# Patient Record
Sex: Male | Born: 1985 | Race: White | Hispanic: No | Marital: Married | Smoking: Former smoker
Health system: Southern US, Community
[De-identification: ages and names within clinical notes are randomized; demographics above are authoritative.]

## PROBLEM LIST (undated history)

## (undated) HISTORY — PX: INGUINAL HERNIA REPAIR: SUR1180

## (undated) HISTORY — PX: HERNIA REPAIR: SHX51

---

## 2008-09-13 ENCOUNTER — Ambulatory Visit: Payer: Self-pay | Admitting: Family Medicine

## 2011-09-24 ENCOUNTER — Ambulatory Visit: Payer: Self-pay | Admitting: Internal Medicine

## 2011-09-24 LAB — URINALYSIS, COMPLETE
Nitrite: NEGATIVE
Protein: NEGATIVE
Specific Gravity: 1.005 (ref 1.003–1.030)

## 2011-09-28 ENCOUNTER — Emergency Department: Payer: Self-pay | Admitting: Emergency Medicine

## 2011-09-29 LAB — URINALYSIS, COMPLETE
Bilirubin,UR: NEGATIVE
Protein: NEGATIVE
RBC,UR: >30 /HPF (ref 0–5)

## 2011-09-29 LAB — CBC WITH DIFFERENTIAL/PLATELET
Basophil #: 0.1 10*3/uL (ref 0.0–0.1)
Basophil %: 0.5 %
Eosinophil #: 0.3 10*3/uL (ref 0.0–0.7)
Eosinophil %: 2.9 %
Lymphocyte #: 2.3 10*3/uL (ref 1.0–3.6)
Lymphocyte %: 19 %
MCH: 28.5 pg (ref 26.0–34.0)
MCHC: 33.4 g/dL (ref 32.0–36.0)
MCV: 85 fL (ref 80–100)
Monocyte #: 0.6 10*3/uL (ref 0.0–0.7)
Platelet: 199 10*3/uL (ref 150–440)
RBC: 5.12 10*6/uL (ref 4.40–5.90)

## 2011-09-29 LAB — COMPREHENSIVE METABOLIC PANEL
Albumin: 4.3 g/dL (ref 3.4–5.0)
BUN: 14 mg/dL (ref 7–18)
Calcium, Total: 9.2 mg/dL (ref 8.5–10.1)
Creatinine: 0.87 mg/dL (ref 0.60–1.30)
Glucose: 90 mg/dL (ref 65–99)
Potassium: 3.6 mmol/L (ref 3.5–5.1)
SGOT(AST): 69 U/L — ABNORMAL HIGH (ref 15–37)

## 2012-02-08 ENCOUNTER — Ambulatory Visit: Payer: Self-pay | Admitting: Medical

## 2012-02-15 ENCOUNTER — Ambulatory Visit: Payer: Self-pay | Admitting: Internal Medicine

## 2013-07-12 ENCOUNTER — Ambulatory Visit: Payer: Self-pay | Admitting: Family Medicine

## 2014-03-16 ENCOUNTER — Ambulatory Visit: Payer: Self-pay | Admitting: Physician Assistant

## 2016-08-18 ENCOUNTER — Ambulatory Visit
Admission: EM | Admit: 2016-08-18 | Discharge: 2016-08-18 | Disposition: A | Discharge status: Home / Self Care | Attending: Family Medicine | Admitting: Family Medicine

## 2016-08-18 ENCOUNTER — Ambulatory Visit (INDEPENDENT_AMBULATORY_CARE_PROVIDER_SITE_OTHER)

## 2016-08-18 ENCOUNTER — Encounter: Payer: Self-pay | Admitting: *Deleted

## 2016-08-18 DIAGNOSIS — M79671 Pain in right foot: Secondary | ICD-10-CM

## 2016-08-18 NOTE — Discharge Instructions (Signed)
Rest. Ice.  Follow up with Podiatry for continued pain as discussed.   Follow up with your primary care physician this week as needed. Return to Urgent care for new or worsening concerns.

## 2016-08-18 NOTE — ED Provider Notes (Signed)
[facility] ____________________________________________  Time seen: Approximately 10:00 AM  I have reviewed the triage vital signs and the nursing notes.   HISTORY  Chief Complaint Foot Pain   HPI Frederick Parker is a 31 y.o. male  presenting for the complaints of right lateral foot pain. Patient reports pain is been present for just over a week. Patient reports a week ago while he was running he noticed a mild pain to his right foot, and reports pain has gradually worsened. Denies any specific injury or rolling of his foot. Patient denies any recent increase of running or activity. Patient reports that he has a frequent runner. Patient again denies any injury. Denies any break in skin, redness, swelling, coloration changes. Denies any pain radiation, paresthesias. Denies taking over-the-counter medications for the same complaint. Patient reports his he has continued to remain ambulatory.  Denies other complaints. Reports otherwise feels well. Denies recent sickness or immobilization.   History reviewed. No pertinent past medical history.  There are no active problems to display for this patient.   Past Surgical History:  Procedure Laterality Date  . HERNIA REPAIR        No current facility-administered medications for this encounter.  No current outpatient prescriptions on file.  Allergies Patient has no known allergies.  History reviewed. No pertinent family history.  Social History Social History  Substance Use Topics  . Smoking status: Never Smoker  . Smokeless tobacco: Never Used  . Alcohol use Yes    Review of Systems Constitutional: No fever/chills ENT: No sore throat. Cardiovascular: Denies chest pain. Respiratory: Denies shortness of breath. Gastrointestinal: No abdominal pain.   Musculoskeletal: Negative for back pain.As above. Skin: Negative for rash. Neurological: Negative for focal weakness or numbness.  10-point ROS otherwise  negative.  ____________________________________________   PHYSICAL EXAM:  VITAL SIGNS: ED Triage Vitals  Enc Vitals Group     BP 08/18/16 0927 129/81     Pulse Rate 08/18/16 0927 70     Resp 08/18/16 0927 16     Temp 08/18/16 0927 98 F (36.7 C)     Temp Source 08/18/16 0927 Oral     SpO2 08/18/16 0927 100 %     Weight 08/18/16 0929 150 lb (68 kg)     Height 08/18/16 0929 5\' 5"  (1.651 m)     Head Circumference --      Peak Flow --      Pain Score 08/18/16 0932 7     Pain Loc --      Pain Edu? --      Excl. in GC? --     Constitutional: Alert and oriented. Well appearing and in no acute distress. Eyes: Conjunctivae are normal. PERRL. EOMI. ENT      Head: Normocephalic and atraumatic.      Mouth/Throat: Mucous membranes are moist. Cardiovascular: Normal rate, regular rhythm. Grossly normal heart sounds.  Good peripheral circulation. Respiratory: Normal respiratory effort without tachypnea nor retractions. Breath sounds are clear and equal bilaterally. No wheezes/rales/rhonchi.. Musculoskeletal:  Ambulatory with mild antalgic gait. Right foot nontender to direct palpation, full range of motion, no swelling, no erythema, no ecchymosis. With patient in standing position and full weightbearing on right foot, pain and tenderness noted to base of right fifth metatarsal. Right lower extremity otherwise nontender. Right foot normal distal sensation. Bilateral pedal pulses equal and easily palpated. Neurologic:  Normal speech and language. Speech is normal. No gait instability.  Skin:  Skin is warm, dry and intact.  No rash noted. Psychiatric: Mood and affect are normal. Speech and behavior are normal. Patient exhibits appropriate insight and judgment   ___________________________________________   LABS (all labs ordered are listed, but only abnormal results are displayed)  Labs Reviewed - No data to display ____________________________________________  RADIOLOGY  Dg Foot  Complete Right  Result Date: 08/18/2016 CLINICAL DATA:  Right lateral foot pain.  No known injury. EXAM: RIGHT FOOT COMPLETE - 3+ VIEW COMPARISON:  None. FINDINGS: There is no evidence of fracture or dislocation. There is no evidence of arthropathy or other focal bone abnormality. Soft tissues are unremarkable. IMPRESSION: Normal examination. Electronically Signed   By: Beckie Salts M.D.   On: 08/18/2016 10:17   ____________________________________________   PROCEDURES Procedures   Declines need for crutches or splinting. ____________________________________________   INITIAL IMPRESSION / ASSESSMENT AND PLAN / ED COURSE  Pertinent labs & imaging results that were available during my care of the patient were reviewed by me and considered in my medical decision making (see chart for details).  Well-appearing patient. No acute distress. Presents with complaints of right foot pain gradual in onset after running. Denies known injury. Denies other complaints. Right foot x-ray no acute abnormality per radiologist. Discussed with patient suspect inflammatory process. Encourage rest, ice, elevate and over-the-counter NSAIDs as needed. Patient denies need for prescription medication. Information given for podiatry for follow-up.  Discussed follow up with Primary care physician this week. Discussed follow up and return parameters including no resolution or any worsening concerns. Patient verbalized understanding and agreed to plan.   ____________________________________________   FINAL CLINICAL IMPRESSION(S) / ED DIAGNOSES  Final diagnoses:  Right foot pain     There are no discharge medications for this patient.   Note: This dictation was prepared with Dragon dictation along with smaller phrase technology. Any transcriptional errors that result from this process are unintentional.         Renford Dills, NP 08/18/16 1037

## 2016-08-18 NOTE — ED Triage Notes (Signed)
Pt runs regularly, last ran Saturday a week ago without difficulty and no injury. Onset of right foot pain on Sunday which gradually worsened over week. Pt has been resting and icing the foot without relief. States pain is to lateral aspect of right foot. No edema, deformity, or discoloration.

## 2016-08-22 ENCOUNTER — Ambulatory Visit (INDEPENDENT_AMBULATORY_CARE_PROVIDER_SITE_OTHER): Admitting: Podiatry

## 2016-08-22 ENCOUNTER — Encounter: Payer: Self-pay | Admitting: Podiatry

## 2016-08-22 VITALS — BP 139/90 | HR 61 | Resp 16

## 2016-08-22 DIAGNOSIS — M7671 Peroneal tendinitis, right leg: Secondary | ICD-10-CM | POA: Diagnosis not present

## 2016-08-22 DIAGNOSIS — M7751 Other enthesopathy of right foot: Secondary | ICD-10-CM

## 2016-08-22 MED ORDER — METHYLPREDNISOLONE 4 MG PO TBPK: ORAL_TABLET | ORAL | 0 refills | Status: DC

## 2016-08-22 NOTE — Progress Notes (Signed)
   Subjective:    Patient ID: Frederick Parker, male    DOB: 02/23/1986, 31 y.o.   MRN: [id number]  HPI    Review of Systems  All other systems reviewed and are negative.      Objective:   Physical Exam        Assessment & Plan:

## 2016-08-27 ENCOUNTER — Encounter: Payer: Self-pay | Admitting: Podiatry

## 2016-08-31 MED ORDER — BETAMETHASONE SOD PHOS & ACET 6 (3-3) MG/ML IJ SUSP: 3.0000 mg | Freq: Once | INTRAMUSCULAR | Status: AC

## 2016-08-31 NOTE — Progress Notes (Signed)
Patient ID: Frederick Parker, male   DOB: 04/14/1986, 31 y.o.   MRN: [id number]   Subjective:  Patient presents today for right lateral foot pain 2 weeks. Patient states that is sore to walk and run. Ibuprofen has no relief. Patient presents today for further treatment    Objective/Physical Exam General: The patient is alert and oriented x3 in no acute distress.  Dermatology: Skin is warm, dry and supple bilateral lower extremities. Negative for open lesions or macerations.  Vascular: Palpable pedal pulses bilaterally. No edema or erythema noted. Capillary refill within normal limits.  Neurological: Epicritic and protective threshold grossly intact bilaterally.   Musculoskeletal Exam: Pain on palpation noted to the insertion of the peroneal brevis tendon at the fifth metatarsal tubercle system of the peroneal brevis enthesopathy. Range of motion within normal limits to all pedal and ankle joints bilateral. Muscle strength 5/5 in all groups bilateral.   Radiographic Exam:  Normal osseous mineralization. Joint spaces preserved. No fracture/dislocation/boney destruction.    Assessment: #1 peroneal enthesopathy right foot 2 pain in right foot   Plan of Care:  #1 Patient was evaluated. #2 injection of 0.5 mL Celestone Soluspan injected right foot at the insertion of the peroneal brevis into the fifth metatarsal tubercle #3 cam boot dispensed #4 compression anklet dispensed #5 prescription for Medrol Dosepak. After completion of the Medrol Dosepak continue Motrin 800 #6 return to clinic in 2 weeks  Patient works for the [employer]. His next weekend is February 8. He will require restrictions and work duty release doctor's note.    Felecia ShellingBrent M. Aashritha Miedema, DPM [facility]  Dr. Felecia ShellingBrent M. Latriece Anstine, DPM    [address]                                        [place], [place] [address]                Office [phone]  Fax [phone]

## 2016-09-03 ENCOUNTER — Telehealth: Payer: Self-pay | Admitting: Podiatry

## 2016-09-03 NOTE — Telephone Encounter (Signed)
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 He states his foot is getting better but he cannot walk/run long distances due to pain.His guard starts Friday morning so he is not able to come in then. I did schedule him to see Dr Ardelle AntonWagoner but since you seen the pt previously are you able to write the note and let him see you next week.

## 2016-09-04 ENCOUNTER — Encounter: Payer: Self-pay | Admitting: Podiatry

## 2016-09-04 ENCOUNTER — Ambulatory Visit (INDEPENDENT_AMBULATORY_CARE_PROVIDER_SITE_OTHER): Admitting: Podiatry

## 2016-09-04 ENCOUNTER — Ambulatory Visit: Admitting: Podiatry

## 2016-09-04 DIAGNOSIS — M7671 Peroneal tendinitis, right leg: Secondary | ICD-10-CM | POA: Diagnosis not present

## 2016-09-04 MED ORDER — MELOXICAM 15 MG PO TABS: 15.0000 mg | ORAL_TABLET | Freq: Every day | ORAL | 2 refills | Status: AC

## 2016-09-04 NOTE — Patient Instructions (Signed)
Peroneal Tendinopathy Rehab Ask your health care provider which exercises are safe for you. Do exercises exactly as told by your health care provider and adjust them as directed. It is normal to feel mild stretching, pulling, tightness, or discomfort as you do these exercises, but you should stop right away if you feel sudden pain or your pain gets worse.Do not begin these exercises until told by your health care provider. Stretching and range of motion exercises These exercises warm up your muscles and joints and improve the movement and flexibility of your ankle. These exercises also help to relieve pain and stiffness. Exercise A: Gastroc and soleus, standing  1. Stand on the edge of a step on the balls of your feet. The ball of your foot is on the walking surface, right under your toes. 2. Hold onto the railing for balance. 3. Slowly lift your left / right foot, allowing your body weight to press your left / right heel down over the edge of the step. You should feel a stretch in your left / right calf. 4. Hold this position for __________ seconds. Repeat __________ times with your left / right knee straight and __________ times with your left / right knee bent. Complete this stretch __________ times per day. Strengthening exercises These exercises improve the strength and endurance of your foot and ankle. Endurance is the ability to use your muscles for a long time, even after they get tired. Exercise B: Dorsiflexors   1. Secure a rubber exercise band or tube to an object, like a table leg, that will not move if it is pulled on. 2. Secure the other end of the band around your left / right foot. 3. Sit on the floor, facing the object with your left / right foot extended. The band or tube should be slightly tense when your foot is relaxed. 4. Slowly flex your left / right ankle and toes to bring your foot toward you. 5. Hold this position for __________ seconds. 6. Slowly return your foot to the  starting position. Repeat __________ times. Complete this exercise __________ times per day. Exercise C: Evertors  1. Sit on the floor with your legs straight out in front of you. 2. Loop a rubber exercise or band or tube around the ball of your left / right foot. The ball of your foot is on the walking surface, right under your toes. 3. Hold the ends of the band in your hands, or secure the band to a stable object. 4. Slowly push your foot outward, away from your other leg. 5. Hold this position for __________ seconds. 6. Slowly return your foot to the starting position. Repeat __________ times. Complete this exercise __________ times per day. Exercise D: Standing heel raise (  plantar flexion) 1. Stand with your feet shoulder-width apart with the balls of your feet on a step. The ball of your foot is on the walking surface, right under your toes. 2. Keep your weight spread evenly over the width of your feet while you rise up on your toes. Use a wall or railing to steady yourself, but try not to use it for support. 3. If this exercise is too easy, try these options:  Shift your weight toward your left / right leg until you feel challenged.  If told by your health care provider, stand on your left / right leg only. 4. Hold this position for __________ seconds. Repeat __________ times. Complete this exercise __________ times per day. Exercise E:   Single leg stand 1. Without shoes, stand near a railing or in a doorway. You may hold onto the railing or door frame as needed. 2. Stand on your left / right foot. Keep your big toe down on the floor and try to keep your arch lifted.  Do not roll to the outside of your foot.  If this exercise is too easy, you can try it with your eyes closed or while standing on a pillow. 3. Hold this position for __________ seconds. Repeat __________ times. Complete this exercise __________ times per day. This information is not intended to replace advice given  to you by your health care provider. Make sure you discuss any questions you have with your health care provider. Document Released: 07/14/2005 Document Revised: 03/20/2016 Document Reviewed: 06/02/2015 Elsevier Interactive Patient Education  2017 Elsevier Inc.  

## 2016-09-08 NOTE — Progress Notes (Addendum)
Subjective: 31 year old male presents the also concerns her right foot pain. 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 He states the injection did help some he has not been wearing the cam boot as it still is painful with the cam boot. He did complete the Medrol Dosepak is been taking anti-inflammatories as needed. The area does continue to become painful. Denies any systemic complaints such as fevers, chills, nausea, vomiting. No acute changes since last appointment, and no other complaints at this time.   Objective: AAO x3, NAD DP/PT pulses palpable bilaterally, CRT less than 3 seconds There is tenderness of the right foot along the distal portion the peroneal tendon on the insertion of the fifth metatarsal base. Peroneal tendon appears to be intact. There is no significant edema, erythema, increase in warmth. Mild pain with eversion. No area pinpoint bony tenderness or pain vibratory sensation. Decrease in medial arch present. No open lesions or pre-ulcerative lesions.  No pain with calf compression, swelling, warmth, erythema  Assessment: Insertional peroneal tendinitis right foot  Plan: -All treatment options discussed with the patient including all alternatives, risks, complications.  -Estimate plantar fascial brace was dispensed and applied from medial to lateral. -I discussed an orthotic as well as shoe gear modifications help develop more support. -Stretching and rehabilitation exercises for peroneal tendinitis. -Ice to the area -Continue anti-inflammatories as needed -Note provided for him to stay out of guard this weekend.  -Patient encouraged to call the office with any questions, concerns, change in symptoms.   Frederick CurdMatthew Parker, DPM

## 2016-09-25 ENCOUNTER — Telehealth: Payer: Self-pay | Admitting: *Deleted

## 2016-09-25 ENCOUNTER — Ambulatory Visit (INDEPENDENT_AMBULATORY_CARE_PROVIDER_SITE_OTHER): Admitting: Podiatry

## 2016-09-25 ENCOUNTER — Encounter: Payer: Self-pay | Admitting: Podiatry

## 2016-09-25 ENCOUNTER — Telehealth: Payer: Self-pay | Admitting: Podiatry

## 2016-09-25 VITALS — BP 121/87 | HR 72 | Resp 18

## 2016-09-25 DIAGNOSIS — M7671 Peroneal tendinitis, right leg: Secondary | ICD-10-CM | POA: Diagnosis not present

## 2016-09-25 DIAGNOSIS — M779 Enthesopathy, unspecified: Secondary | ICD-10-CM

## 2016-09-25 NOTE — Telephone Encounter (Signed)
Pt. Frederick Parker that he needs a referral for PT.

## 2016-09-25 NOTE — Telephone Encounter (Signed)
Pt states he need PT.

## 2016-09-25 NOTE — Telephone Encounter (Signed)
I gave Frederick Parker a [facility] PT note today. If not could you fax it for him for peroneal tendonitis, eval and treat. Thanks.

## 2016-09-25 NOTE — Progress Notes (Signed)
Subjective: 31 year old male presents to the office today for follow-up evaluation of right foot pain, insertional peroneal tendinitis. He states that since last appointment he is doing much better on a regular basis with walking however he did try to run and he was only able to do so for couple of minutes before the pain started and he had to stop. He has been doing the stretching, icing at home. He denies any swelling or any recent injury or trauma.  Denies any systemic complaints such as fevers, chills, nausea, vomiting. No acute changes since last appointment, and no other complaints at this time.   Objective: AAO x3, NAD DP/PT pulses palpable bilaterally, CRT less than 3 seconds There is currently no tenderness of the right foot along the distal portion the peroneal tendon on the insertion of the fifth metatarsal base. Peroneal tendon appears to be intact. There is no significant edema, erythema, increase in warmth. Mild pain with eversion. No area pinpoint bony tenderness or pain vibratory sensation. Decrease in medial arch present. No open lesions or pre-ulcerative lesions.  No pain with calf compression, swelling, warmth, erythema  Assessment: Insertional peroneal tendinitis right foot currently without symptoms however with pain after activity  Plan: -All treatment options discussed with the patient including all alternatives, risks, complications.  -He has a good over-the-counter insert and did add a lateral post to see if this will help. Discussed with him that if symptoms continue may need a custom insert. We'll also start physical therapy to help rehabilitation this and get him back to running. A note was provided today to hold off on running while at [employer] this weekend. -Continue anti-inflammatories as needed -Patient encouraged to call the office with any questions, concerns, change in symptoms.   Frederick CurdMatthew Ashad Parker, DPM

## 2016-10-28 ENCOUNTER — Encounter: Payer: Self-pay | Admitting: Podiatry

## 2016-10-28 ENCOUNTER — Ambulatory Visit (INDEPENDENT_AMBULATORY_CARE_PROVIDER_SITE_OTHER): Admitting: Podiatry

## 2016-10-28 DIAGNOSIS — S86311D Strain of muscle(s) and tendon(s) of peroneal muscle group at lower leg level, right leg, subsequent encounter: Secondary | ICD-10-CM | POA: Diagnosis not present

## 2016-10-28 DIAGNOSIS — M7671 Peroneal tendinitis, right leg: Secondary | ICD-10-CM

## 2016-10-29 NOTE — Progress Notes (Signed)
   Subjective: 31 year old male presents to the office today for follow-up evaluation of right foot pain, insertional peroneal tendinitis. Pt states he is doing better. He reports he is still in physical therapy and has finished his antiinflammatory medication. Walking for more than 30-60 minutes at a time increases his pain. He has been doing the stretching, icing at home. He denies any swelling or any recent injury or trauma.  Denies any systemic complaints such as fevers, chills, nausea, vomiting. No acute changes since last appointment, and no other complaints at this time.      Objective/Physical Exam General: The patient is alert and oriented x3 in no acute distress.  Dermatology: Skin is warm, dry and supple bilateral lower extremities. Negative for open lesions or macerations.  Vascular: Palpable pedal pulses bilaterally. No edema or erythema noted. Capillary refill within normal limits.  Neurological: Epicritic and protective threshold grossly intact bilaterally.   Musculoskeletal Exam: Pain on palpation to the insertion of the peroneal tendons of the right foot.   Assessment: #1 Insertional peroneal tendinitis of the right foot   Plan of Care:  #1 Patient was evaluated. #2 MRI ordered #3 RTC 3-4 weeks   Felecia Shelling, DPM [facility]  Dr. Felecia Shelling, DPM    [address]                                        [place], [place] [address]                Office [phone]  Fax [phone]

## 2016-10-31 ENCOUNTER — Telehealth: Payer: Self-pay | Admitting: *Deleted

## 2016-10-31 DIAGNOSIS — T148XXA Other injury of unspecified body region, initial encounter: Secondary | ICD-10-CM

## 2016-10-31 NOTE — Telephone Encounter (Addendum)
-----   Message from Felecia Shelling, DPM sent at 10/30/2016  3:42 PM EDT ----- Regarding: MRI right foot Please order MRI right foot w/ or w/out contrast.  Dx : Possible peroneal tendon tear right lower extremity  Thanks, Dr. Logan Bores. 10/31/2016-Orders faxed to [facility] for pre-cert and to [facility]. 11/03/2016-Routed message to [facility].

## 2016-11-06 NOTE — Telephone Encounter (Signed)
No prior auth required for MRI.  Patient called and informed via voice mail to call radiology scheduling to set up appointment.

## 2016-11-20 ENCOUNTER — Ambulatory Visit
Admission: RE | Admit: 2016-11-20 | Discharge: 2016-11-20 | Disposition: A | Discharge status: Home / Self Care | Source: Ambulatory Visit | Attending: Podiatry | Admitting: Podiatry

## 2016-11-20 DIAGNOSIS — S96801A Unspecified injury of other specified muscles and tendons at ankle and foot level, right foot, initial encounter: Secondary | ICD-10-CM | POA: Diagnosis present

## 2016-11-25 ENCOUNTER — Encounter: Payer: Self-pay | Admitting: Podiatry

## 2016-11-25 ENCOUNTER — Ambulatory Visit (INDEPENDENT_AMBULATORY_CARE_PROVIDER_SITE_OTHER): Admitting: Podiatry

## 2016-11-25 DIAGNOSIS — M7671 Peroneal tendinitis, right leg: Secondary | ICD-10-CM

## 2016-11-25 NOTE — Progress Notes (Signed)
   Subjective: 31 year old male presents to the office today for follow-up evaluation of right foot pain, insertional peroneal tendinitis. Pt states he is doing better although standing for long periods of time still cause him pain. He is here for his MRI results as well. He denies any new complaints at this time.     Objective/Physical Exam General: The patient is alert and oriented x3 in no acute distress.  Dermatology: Skin is warm, dry and supple bilateral lower extremities. Negative for open lesions or macerations.  Vascular: Palpable pedal pulses bilaterally. No edema or erythema noted. Capillary refill within normal limits.  Neurological: Epicritic and protective threshold grossly intact bilaterally.   Musculoskeletal Exam: Pain on palpation to the insertion of the peroneal tendons of the right foot.   MRI results: Intact and normal appearing peroneal tendons. Normal MRI right ankle.  Assessment: #1 Insertional peroneal tendinitis of the right foot   Plan of Care:  #1 Patient was evaluated. MRI reviewed.  #2 Injection of 0.5 mLs Celestone Soluspan injected into the insertion of the peroneal tendons-right #3 Immobilize in CAM boot x 4 weeks #4 Return to clinic in 4 weeks   Felecia Shelling, DPM [facility]  Dr. Felecia Shelling, DPM    [address]                                        [place], [place] [address]                Office [phone]  Fax [phone]

## 2016-11-27 MED ORDER — BETAMETHASONE SOD PHOS & ACET 6 (3-3) MG/ML IJ SUSP: 3.0000 mg | Freq: Once | INTRAMUSCULAR | Status: AC

## 2016-11-28 ENCOUNTER — Encounter: Payer: Self-pay | Admitting: Podiatry

## 2016-12-26 ENCOUNTER — Ambulatory Visit: Admitting: Podiatry

## 2018-07-12 IMAGING — CR DG FOOT COMPLETE 3+V*R*
3 series · 3 of 3 positions shown · non-contrast
Comparison: None.

CLINICAL DATA: Right lateral foot pain.  No known injury.

EXAM:
RIGHT FOOT COMPLETE - 3+ VIEW

[foot ap]
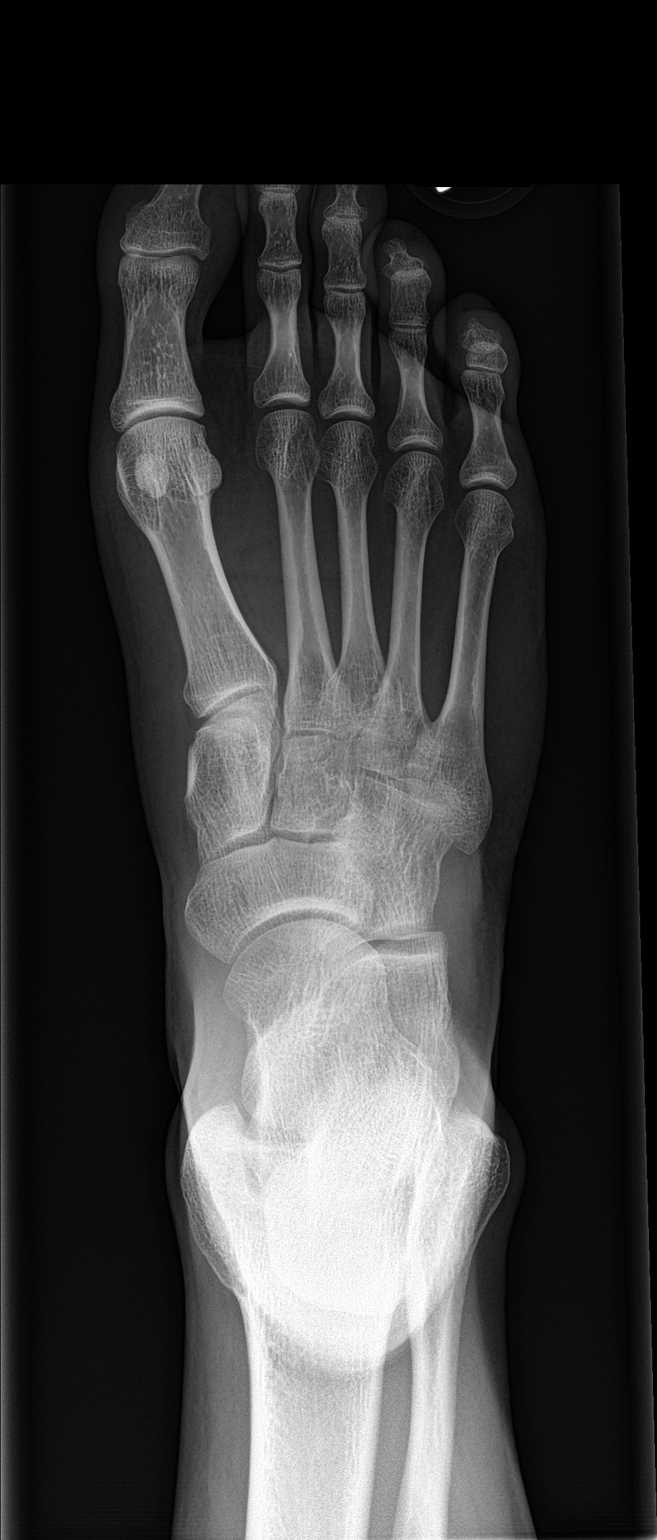

[foot obl]
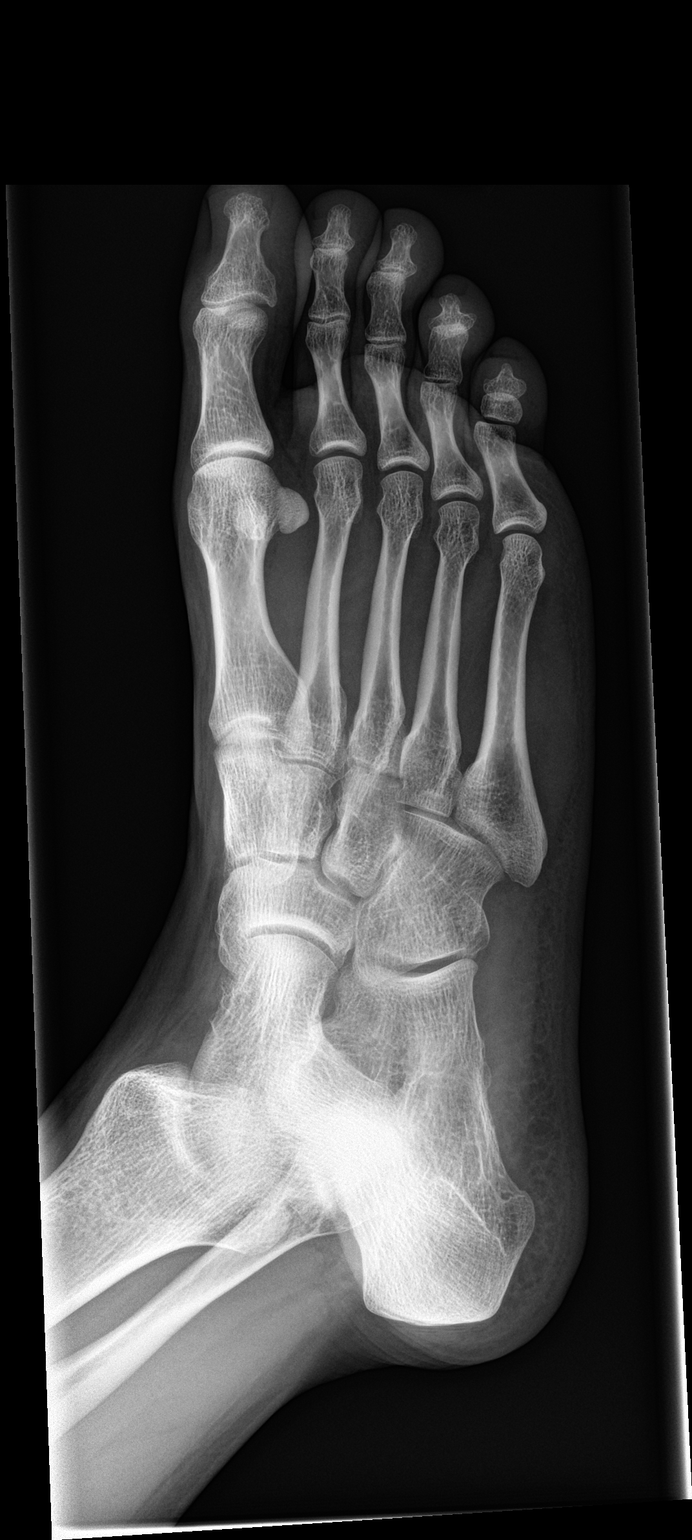

[foot lat]
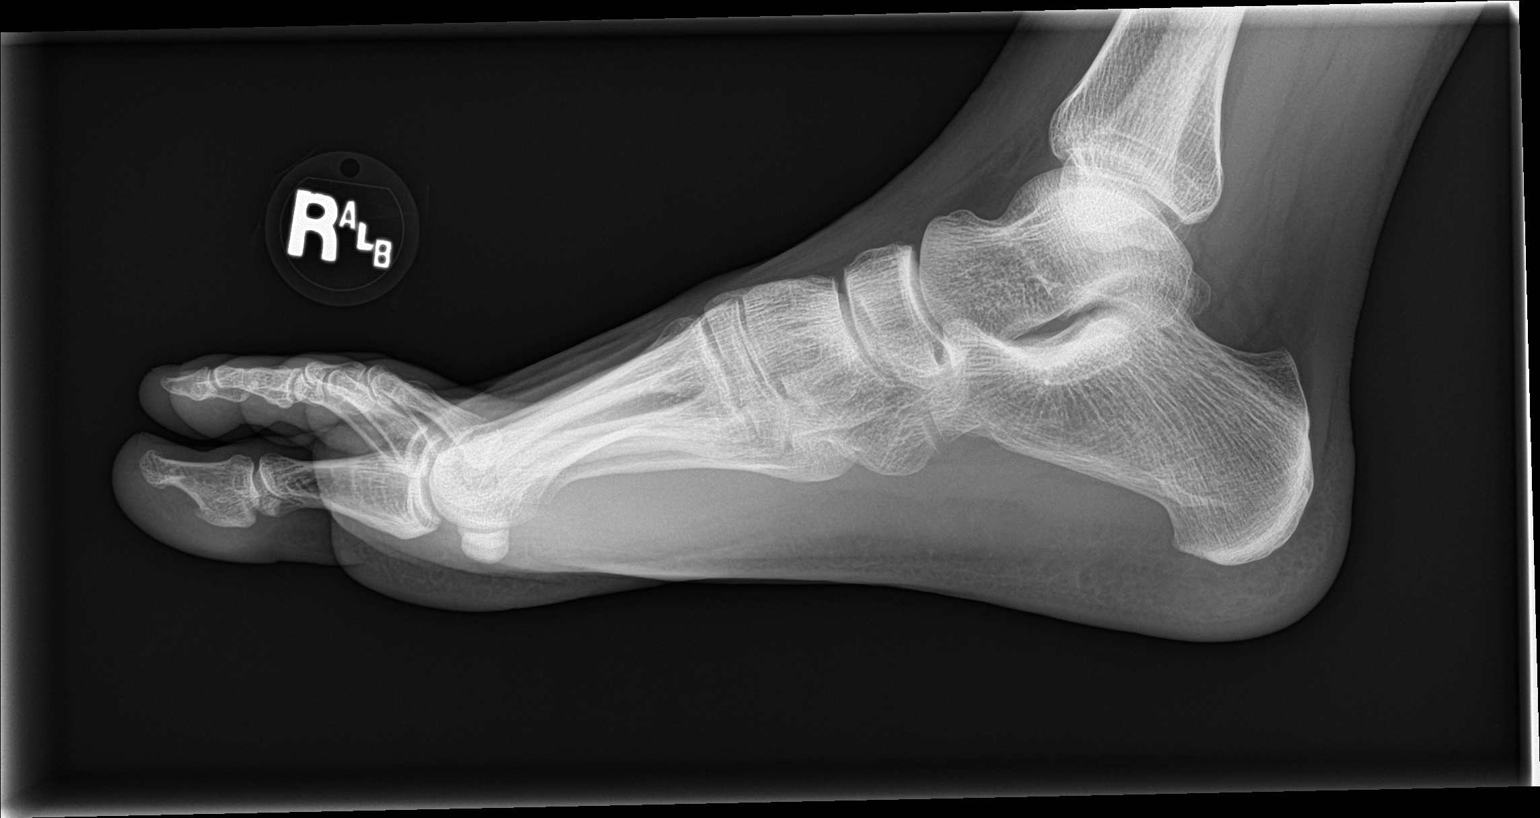

[3 of 3 positions shown; findings below may reference images not displayed]

FINDINGS: There is no evidence of fracture or dislocation. There is no
evidence of arthropathy or other focal bone abnormality. Soft
tissues are unremarkable.
IMPRESSION: Normal examination.

## 2018-10-14 IMAGING — MR MR FOOT*R* W/O CM
6 series · 40 of 40 positions shown · non-contrast
Comparison: Plain films right ankle 08/18/2016.

CLINICAL DATA: Lateral right ankle pain since July 2016. No
known injury. Possible peroneal tendon tear.

EXAM:
MRI OF THE RIGHT FOREFOOT WITHOUT CONTRAST
TECHNIQUE: Multiplanar, multisequence MR imaging of the ankle was performed. No
intravenous contrast was administered.

[Series 4: T2 fat-sat · axial · 3.0mm · 0.62mm/px · z∈[-100,+29]mm · 8 of 40 slices shown (1 of 3)]
[im 1/40]
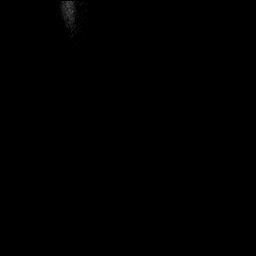
[im 6/40]
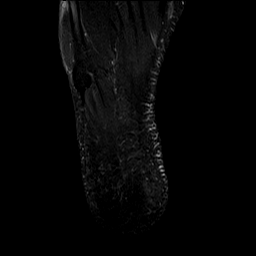
[im 12/40]
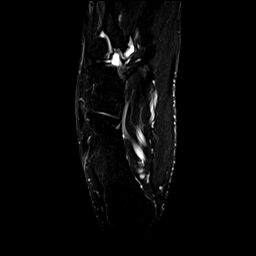
[im 17/40]
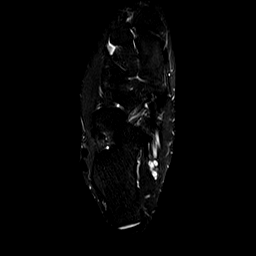
[im 23/40]
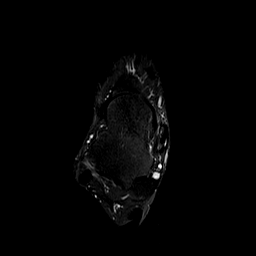
[im 28/40]
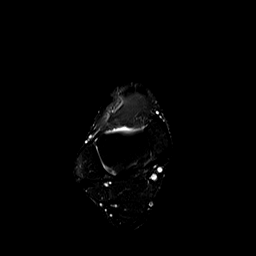
[im 34/40]
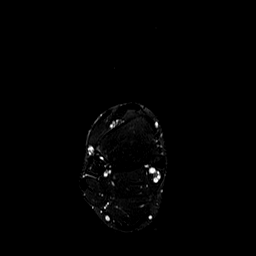
[im 40/40]
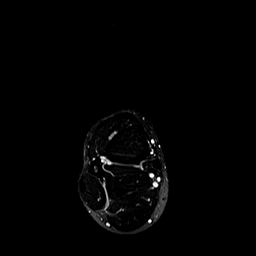

[Series 5: PD fat-sat · axial · 3.0mm · 0.62mm/px · z∈[-100,+29]mm · 8 of 40 slices shown]
[im 1/40]
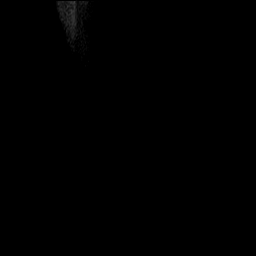
[im 6/40]
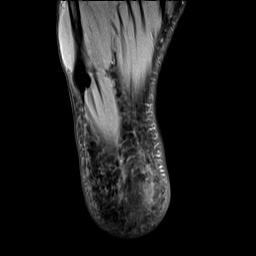
[im 12/40]
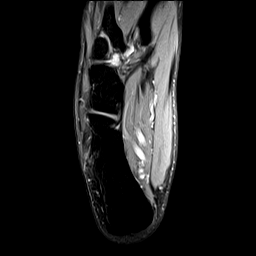
[im 17/40]
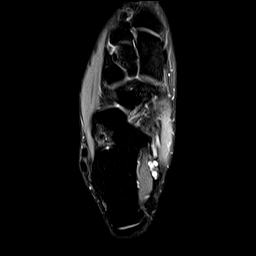
[im 23/40]
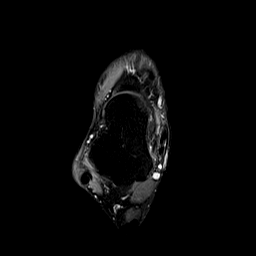
[im 28/40]
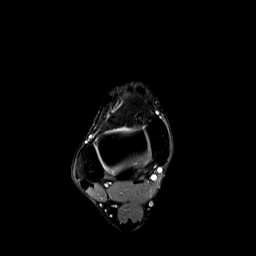
[im 34/40]
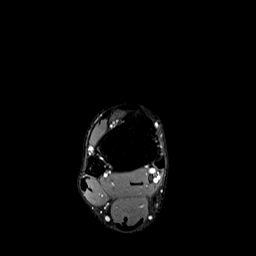
[im 40/40]
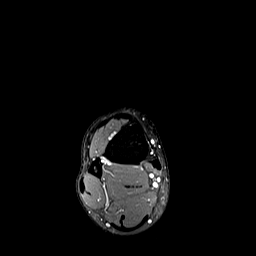

[Series 6: T2 fat-sat · coronal · 3.0mm · 0.70mm/px · 9 of 46 slices shown (2 of 3)]
[im 1/46]
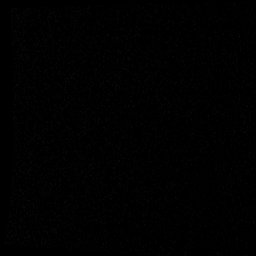
[im 6/46]
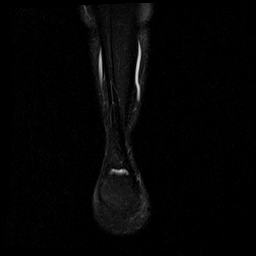
[im 12/46]
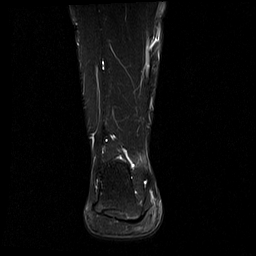
[im 17/46]
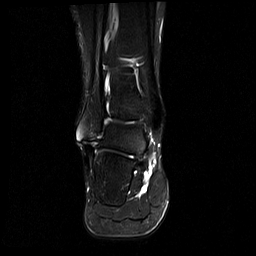
[im 23/46]
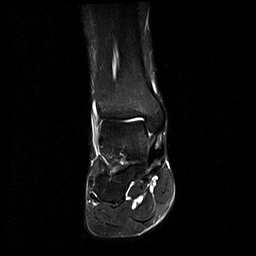
[im 29/46]
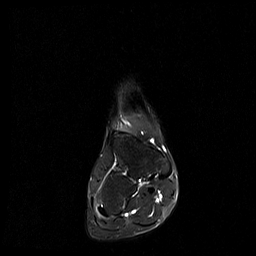
[im 34/46]
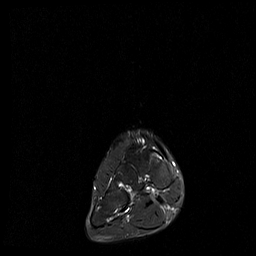
[im 40/46]
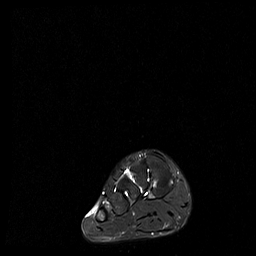
[im 46/46]
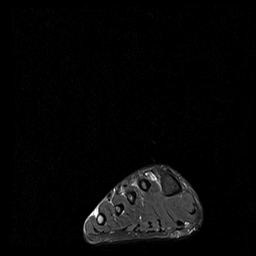

[Series 7: T1 · sagittal · 3.0mm · 0.56mm/px · 5 of 25 slices shown]
[im 1/25]
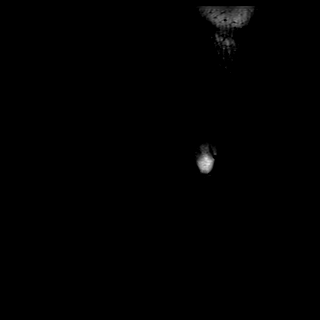
[im 7/25]
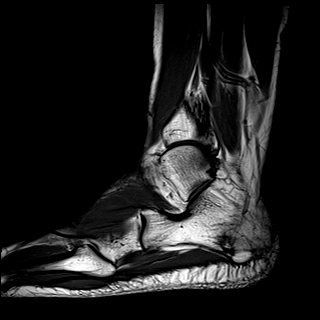
[im 13/25]
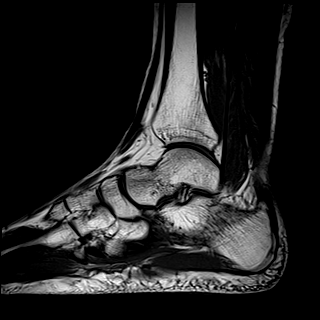
[im 19/25]
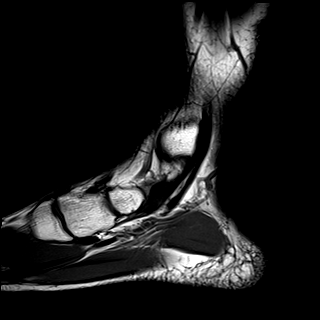
[im 25/25]
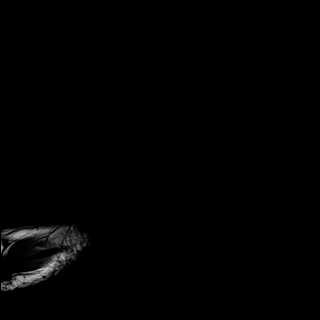

[Series 8: STIR · sagittal · 3.0mm · 0.70mm/px · 5 of 25 slices shown]
[im 1/25]
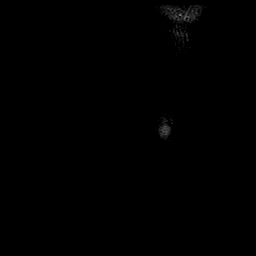
[im 7/25]
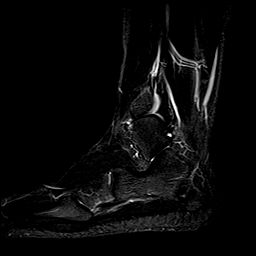
[im 13/25]
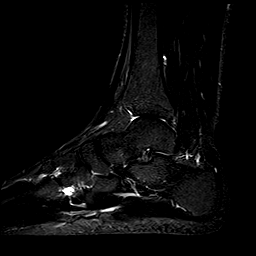
[im 19/25]
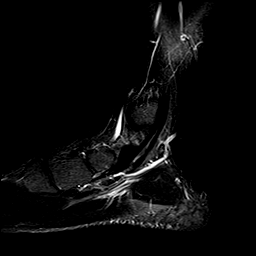
[im 25/25]
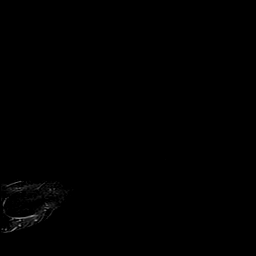

[Series 9: T2 fat-sat · sagittal · 3.0mm · 0.70mm/px · 5 of 25 slices shown (3 of 3)]
[im 1/25]
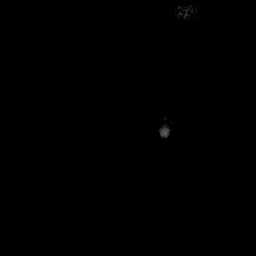
[im 7/25]
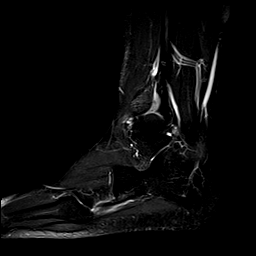
[im 13/25]
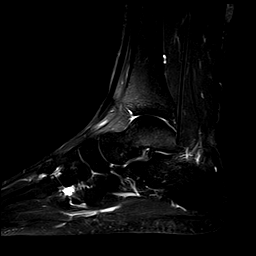
[im 19/25]
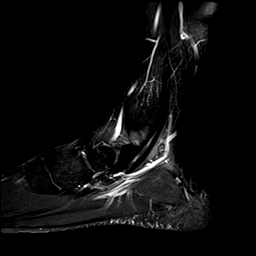
[im 25/25]
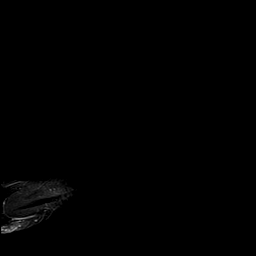

[40 of 40 positions shown; findings below may reference images not displayed]

FINDINGS: TENDONS

Peroneal: Intact and normal in appearance.

Posteromedial: Intact and normal in appearance.

Anterior: Intact and normal in appearance.

Achilles: Intact and normal in appearance.

Plantar Fascia: Intact and normal in appearance.

LIGAMENTS

Lateral: Intact and normal in appearance.

Medial: Intact and normal in appearance.

CARTILAGE

Ankle Joint: Appears normal.

Subtalar Joints/Sinus Tarsi: Appear normal.

Bones: No fracture or worrisome marrow lesion. Increased T2 signal
in the far periphery of the lateral malleolus on the axial and
coronal images is likely artifactual as it cannot be confirmed on
sagittal imaging and signal is normal on T1 weighted images. No
tarsal coalition.

Other: None.
IMPRESSION: Intact and normal appearing peroneal tendons. Normal MRI right
ankle.

## 2021-05-28 NOTE — Progress Notes (Signed)
05/29/2021 2:10 PM   Frederick Parker August 08, 1985 [id number]  Referring provider: No referring provider defined for this encounter.  Chief Complaint  Patient presents with   VAS Consult    New Patient    HPI: 35 y.o. year old male referred for further evaluation of possible vasectomy.  He denies a history of testicular trauma or pain.  No urinary issues.  No previous scrotal surgeries.  He has 2 children and one baby on the way.   PMH: No past medical history on file.  Surgical History: Past Surgical History:  Procedure Laterality Date   INGUINAL HERNIA REPAIR Left     Home Medications:  Allergies as of 05/29/2021   No Known Allergies      Medication List    as of May 29, 2021  2:10 PM   You have not been prescribed any medications.     Allergies: No Known Allergies  Family History: Family History  Problem Relation Age of Onset   Prostate cancer Neg Hx    Bladder Cancer Neg Hx    Kidney cancer Neg Hx     Social History:  reports that he has quit smoking. His smoking use included cigarettes. He has never used smokeless tobacco. He reports current alcohol use. No history on file for drug use.   Physical Exam: BP (!) 153/91   Pulse 90   Ht 5\' 5"  (1.651 m)   Wt 157 lb (71.2 kg)   BMI 26.13 kg/m   Constitutional:  Alert and oriented, No acute distress. HEENT: [place] AT, moist mucus membranes.  Trachea midline, no masses. Cardiovascular: No clubbing, cyanosis, or edema. Respiratory: Normal respiratory effort, no increased work of breathing. GI: Abdomen is soft, nontender, nondistended, no abdominal masses GU: Normal phallus.  Bilateral descended testicles without masses.  Vasa easily palpable bilaterally. Skin: No rashes, bruises or suspicious lesions. Neurologic: Grossly intact, no focal deficits, moving all 4 extremities. Psychiatric: Normal mood and affect.   Assessment & Plan:    1. Vasectomy evaluation - Today, we discussed what the vas  deferens is, where it is located, and its function. We reviewed the procedure for vasectomy, it's risks, benefits, alternatives, and likelihood of achieving his goals. We discussed in detail the procedure, complications, and recovery as well as the need for clearance prior to unprotected intercourse. We discussed that vasectomy does not protect against sexually transmitted diseases. We discussed that this procedure does not result in immediate sterility and that they would need to use other forms of birth control until he has been cleared with negative postvasectomy semen analyses. I explained that the procedure is considered to be permanent and that attempts at reversal have varying degrees of success. These options include vasectomy reversal, sperm retrieval, and in vitro fertilization; these can be very expensive. We discussed the chance of postvasectomy pain syndrome which occurs in less than 5% of patients. I explained to the patient that there is no treatment to resolve this chronic pain, and that if it developed I would not be able to help resolve the issue, but that surgery is generally not needed for correction. I explained there have even been reports of systemic like illness associated with this chronic pain, and that there was no good cure. I explained that vasectomy it is not a 100% reliable form of birth control, and the risk of pregnancy after vasectomy is approximately 1 in 2000 men who had a negative postvasectomy semen analysis or rare non-motile sperm. I explained that  repeat vasectomy was necessary in less than 1% of vasectomy procedures when employing the type of technique that I use. I explained that he should refrain from ejaculation for approximately one week following vasectomy. I explained that there are other options for birth control which are permanent and non-permanent; we discussed these. I explained the rates of surgical complications, such as symptomatic hematoma or infection, are  low (1-2%) and vary with the surgeon's experience and criteria used to diagnose the complication.   The patient had the opportunity to ask questions to his stated satisfaction. He voiced understanding of the above factors and stated that he has read all the information provided to him and the packets and informed consent.  He is interested in receiving of Valium 10 mg prior to the procedure for the purpose of anxiolysis.  A prescription was given today.  He will have a driver on the day of the procedure.   Return for VAS.  I,Kailey Littlejohn,acting as a Neurosurgeon for Vanna Scotland, MD.,have documented all relevant documentation on the behalf of Vanna Scotland, MD,as directed by  Vanna Scotland, MD while in the presence of Vanna Scotland, MD.   [facility] [address], [address] [place], [place] [address] [phone]

## 2021-05-29 ENCOUNTER — Encounter: Payer: Self-pay | Admitting: Urology

## 2021-05-29 ENCOUNTER — Ambulatory Visit (INDEPENDENT_AMBULATORY_CARE_PROVIDER_SITE_OTHER): Payer: Managed Care, Other (non HMO) | Admitting: Urology

## 2021-05-29 ENCOUNTER — Other Ambulatory Visit: Payer: Self-pay

## 2021-05-29 VITALS — BP 153/91 | HR 90 | Ht 65.0 in | Wt 157.0 lb

## 2021-05-29 DIAGNOSIS — M722 Plantar fascial fibromatosis: Secondary | ICD-10-CM | POA: Insufficient documentation

## 2021-05-29 DIAGNOSIS — Z3009 Encounter for other general counseling and advice on contraception: Secondary | ICD-10-CM | POA: Diagnosis not present

## 2021-05-29 NOTE — Patient Instructions (Signed)

## 2021-05-30 MED ORDER — DIAZEPAM 10 MG PO TABS: 10.0000 mg | ORAL_TABLET | Freq: Once | ORAL | 0 refills | Status: AC

## 2021-06-10 NOTE — Progress Notes (Signed)
06/11/2021  CC:  Chief Complaint  Patient presents with   VAS    HPI: Frederick Parker is a 35 y.o. male who presents today for a vasectomy.  Consent and identity was confirmed.  All additional questions answered.  He denies a history of testicular trauma or pain.  No urinary issues.  No previous scrotal surgeries.   He has 2 children and one baby on the way.   Vitals:   06/11/21 1139  BP: (!) 144/85  Pulse: 89  ' NED. A&Ox3.   No respiratory distress   Abd soft, NT, ND Normal external genitalia with patent urethral meatus   Bilateral Vasectomy Procedure  Pre-Procedure: - Patient's scrotum was prepped and draped for vasectomy. - The vas was palpated through the scrotal skin on the left. - 1% Xylocaine was injected into the skin and surrounding tissue for placement  - In a similar manner, the vas on the right was identified, anesthetized, and stabilized.  Procedure: - A #11 blade was used to make a small stab incision in the skin overlying the vas - The left vas was isolated and brought up through the incision exposing that structure. - Bleeding points were cauterized as they occurred. - The vas was free from the surrounding structures and brought to the view. - A segment was positioned for placement with a hemostat. - A second hemostat was placed and a small segment between the two hemostats and was removed for inspection. - Each end of the transected vas lumen was fulgurated/ obliterated using needlepoint electrocautery -A fascial interposition was performed on testicular end of the vas using #3-0 chromic suture -Notably, the vasa were somewhat difficult to isolate due to extremely tough perivasal tissue. -The same procedure was performed on the right. - A single suture of #3-0 chromic catgut was used to close each lateral scrotal skin incision - A dressing was applied.  Post-Procedure: - Patient was instructed in care of the operative area - A specimen is to be  delivered in 12 weeks   -Another form of contraception is to be used until post vasectomy semen analysis  Tawni Millers as a scribe for Vanna Scotland, MD.,have documented all relevant documentation on the behalf of Vanna Scotland, MD,as directed by  Vanna Scotland, MD while in the presence of Vanna Scotland, MD.  I have reviewed the above documentation for accuracy and completeness, and I agree with the above.   Vanna Scotland, MD

## 2021-06-11 ENCOUNTER — Encounter: Payer: Self-pay | Admitting: Urology

## 2021-06-11 ENCOUNTER — Other Ambulatory Visit: Payer: Self-pay

## 2021-06-11 ENCOUNTER — Ambulatory Visit (INDEPENDENT_AMBULATORY_CARE_PROVIDER_SITE_OTHER): Payer: Managed Care, Other (non HMO) | Admitting: Urology

## 2021-06-11 VITALS — BP 144/85 | HR 89 | Ht 65.0 in | Wt 157.0 lb

## 2021-06-11 DIAGNOSIS — Z3009 Encounter for other general counseling and advice on contraception: Secondary | ICD-10-CM

## 2021-06-11 NOTE — Patient Instructions (Signed)

## 2021-09-17 ENCOUNTER — Other Ambulatory Visit: Payer: Managed Care, Other (non HMO)

## 2021-09-17 ENCOUNTER — Other Ambulatory Visit: Payer: Self-pay

## 2021-09-17 DIAGNOSIS — Z3009 Encounter for other general counseling and advice on contraception: Secondary | ICD-10-CM

## 2021-09-19 ENCOUNTER — Encounter: Payer: Self-pay | Admitting: Urology

## 2021-09-19 LAB — POST-VAS SPERM EVALUATION,QUAL: Volume: 0.8 mL

## 2021-09-19 NOTE — Telephone Encounter (Signed)
Patient notified, voiced understanding.
